# Patient Record
Sex: Male | Born: 1960 | Race: Black or African American | Hispanic: No | Marital: Married | Smoking: Never smoker
Health system: Southern US, Community
[De-identification: ages and names within clinical notes are randomized; demographics above are authoritative.]

## PROBLEM LIST (undated history)

## (undated) DIAGNOSIS — E7800 Pure hypercholesterolemia, unspecified: Secondary | ICD-10-CM

## (undated) DIAGNOSIS — I10 Essential (primary) hypertension: Secondary | ICD-10-CM

## (undated) HISTORY — PX: FOOT FUSION: SHX956

---

## 2021-01-01 ENCOUNTER — Emergency Department (HOSPITAL_BASED_OUTPATIENT_CLINIC_OR_DEPARTMENT_OTHER)
Admission: EM | Admit: 2021-01-01 | Discharge: 2021-01-02 | Disposition: A | Discharge status: Home / Self Care | Payer: No Typology Code available for payment source | Attending: Emergency Medicine | Admitting: Emergency Medicine

## 2021-01-01 ENCOUNTER — Emergency Department (HOSPITAL_BASED_OUTPATIENT_CLINIC_OR_DEPARTMENT_OTHER): Payer: No Typology Code available for payment source

## 2021-01-01 ENCOUNTER — Other Ambulatory Visit: Payer: Self-pay

## 2021-01-01 ENCOUNTER — Encounter (HOSPITAL_BASED_OUTPATIENT_CLINIC_OR_DEPARTMENT_OTHER): Payer: Self-pay | Admitting: Emergency Medicine

## 2021-01-01 DIAGNOSIS — R600 Localized edema: Secondary | ICD-10-CM | POA: Diagnosis not present

## 2021-01-01 DIAGNOSIS — R0789 Other chest pain: Secondary | ICD-10-CM | POA: Insufficient documentation

## 2021-01-01 DIAGNOSIS — R197 Diarrhea, unspecified: Secondary | ICD-10-CM | POA: Insufficient documentation

## 2021-01-01 DIAGNOSIS — R112 Nausea with vomiting, unspecified: Secondary | ICD-10-CM

## 2021-01-01 DIAGNOSIS — E860 Dehydration: Secondary | ICD-10-CM | POA: Insufficient documentation

## 2021-01-01 DIAGNOSIS — R55 Syncope and collapse: Secondary | ICD-10-CM

## 2021-01-01 DIAGNOSIS — Z91040 Latex allergy status: Secondary | ICD-10-CM | POA: Insufficient documentation

## 2021-01-01 DIAGNOSIS — I10 Essential (primary) hypertension: Secondary | ICD-10-CM | POA: Insufficient documentation

## 2021-01-01 DIAGNOSIS — R000 Tachycardia, unspecified: Secondary | ICD-10-CM | POA: Insufficient documentation

## 2021-01-01 HISTORY — DX: Pure hypercholesterolemia, unspecified: E78.00

## 2021-01-01 HISTORY — DX: Essential (primary) hypertension: I10

## 2021-01-01 LAB — CBC
HCT: 50.1 % (ref 39.0–52.0)
Hemoglobin: 17.4 g/dL — ABNORMAL HIGH (ref 13.0–17.0)
MCH: 30.1 pg (ref 26.0–34.0)
MCHC: 34.7 g/dL (ref 30.0–36.0)
MCV: 86.7 fL (ref 80.0–100.0)
Platelets: 210 10*3/uL (ref 150–400)
RBC: 5.78 MIL/uL (ref 4.22–5.81)
RDW: 13.5 % (ref 11.5–15.5)
WBC: 8.3 10*3/uL (ref 4.0–10.5)
nRBC: 0 % (ref 0.0–0.2)

## 2021-01-01 LAB — BASIC METABOLIC PANEL
Anion gap: 11 (ref 5–15)
BUN: 15 mg/dL (ref 6–20)
CO2: 18 mmol/L — ABNORMAL LOW (ref 22–32)
Calcium: 9.6 mg/dL (ref 8.9–10.3)
Chloride: 107 mmol/L (ref 98–111)
Creatinine, Ser: 1.39 mg/dL — ABNORMAL HIGH (ref 0.61–1.24)
GFR, Estimated: 59 mL/min — ABNORMAL LOW (ref >60)
Glucose, Bld: 135 mg/dL — ABNORMAL HIGH (ref 70–99)
Potassium: 4.2 mmol/L (ref 3.5–5.1)
Sodium: 136 mmol/L (ref 135–145)

## 2021-01-01 LAB — HEPATIC FUNCTION PANEL
ALT: 20 U/L (ref 0–44)
AST: 24 U/L (ref 15–41)
Albumin: 4.6 g/dL (ref 3.5–5.0)
Alkaline Phosphatase: 57 U/L (ref 38–126)
Bilirubin, Direct: 0.2 mg/dL (ref 0.0–0.2)
Indirect Bilirubin: 1.1 mg/dL — ABNORMAL HIGH (ref 0.3–0.9)
Total Bilirubin: 1.3 mg/dL — ABNORMAL HIGH (ref 0.3–1.2)
Total Protein: 8.6 g/dL — ABNORMAL HIGH (ref 6.5–8.1)

## 2021-01-01 LAB — TROPONIN I (HIGH SENSITIVITY): Troponin I (High Sensitivity): 2 ng/L (ref <18)

## 2021-01-01 MED ORDER — ONDANSETRON HCL 4 MG/2ML IJ SOLN
4.0000 mg | Freq: Once | INTRAMUSCULAR | Status: AC
  Administered 2021-01-01: 4 mg via INTRAVENOUS
  Filled 2021-01-01: qty 2

## 2021-01-01 MED ORDER — LACTATED RINGERS IV BOLUS
1000.0000 mL | Freq: Once | INTRAVENOUS | Status: AC
  Administered 2021-01-02: 1000 mL via INTRAVENOUS

## 2021-01-01 MED ORDER — ALUM & MAG HYDROXIDE-SIMETH 200-200-20 MG/5ML PO SUSP
30.0000 mL | Freq: Once | ORAL | Status: AC
  Administered 2021-01-01: 30 mL via ORAL
  Filled 2021-01-01: qty 30

## 2021-01-01 MED ORDER — ACETAMINOPHEN 500 MG PO TABS
1000.0000 mg | ORAL_TABLET | Freq: Once | ORAL | Status: AC
  Administered 2021-01-01: 1000 mg via ORAL
  Filled 2021-01-01: qty 2

## 2021-01-01 MED ORDER — LACTATED RINGERS IV BOLUS
1000.0000 mL | Freq: Once | INTRAVENOUS | Status: AC
  Administered 2021-01-01: 1000 mL via INTRAVENOUS

## 2021-01-01 NOTE — ED Provider Notes (Signed)
[facility] EMERGENCY DEPARTMENT Provider Note   CSN: [id number] Arrival date & time: 01/01/21  2115     History Chief Complaint  Patient presents with  . Chest Pain  . Vomiting  . Loss of Consciousness    Derrick Harrison is a 60 y.o. male.  60yo M w/ PMH including HTN, HLD, urethral stricture who p/w vomiting and diarrhea. Pt reports that around 9am this morning, he began having N/V associated w/ non-bloody diarrhea. No associated abd pain. This evening, he walked to the bathroom and began feeling lightheaded and sweaty. He then vomited and passed out. He denies any pain from the fall, wife thinks he may have bumped his head but he denies any head pain.  Approx 30 min after he passed out, he began having chest pain which he describes as a burning and tightness that goes from one side to the other side of his chest. It has been constant for the past 2.5 hours. No SOB but he does feel heart racing. No body aches but he does endorse sneezing. No cough or urinary symptoms. No sick contacts or recent travel.   The history is provided by the patient.  Chest Pain Associated symptoms: syncope   Loss of Consciousness Associated symptoms: chest pain        Past Medical History:  Diagnosis Date  . Elevated cholesterol   . Hypertension   Urethral stricture  There are no problems to display for this patient.   ** The histories are not reviewed yet. Please review them in the "History" navigator section and refresh this SmartLink.     No family history on file.  Social History   Tobacco Use  . Smoking status: Never Smoker  . Smokeless tobacco: Never Used  Vaping Use  . Vaping Use: Never used  Substance Use Topics  . Alcohol use: Yes    Alcohol/week: 6.0 standard drinks    Types: 6 Cans of beer per week    Comment: 3 x weekly  . Drug use: Never    Home Medications Prior to Admission medications   Not on File    Allergies    Latex  Review of Systems    Review of Systems  Cardiovascular: Positive for chest pain and syncope.  All other systems reviewed and are negative except that which was mentioned in HPI   Physical Exam Updated Vital Signs BP (!) 133/97   Pulse (!) 121   Temp (!) 100.5 F (38.1 C) (Oral)   Resp (!) 23   Ht 5\' 8"  (1.727 m)   Wt 102.8 kg   SpO2 97%   BMI 34.46 kg/m   Physical Exam Vitals and nursing note reviewed.  Constitutional:      General: He is not in acute distress.    Appearance: Normal appearance.  HENT:     Head: Normocephalic.     Comments: Trace edema above L eyebrow, non-tender Eyes:     Conjunctiva/sclera: Conjunctivae normal.  Cardiovascular:     Rate and Rhythm: Regular rhythm. Tachycardia present.     Heart sounds: Normal heart sounds. No murmur heard.   Pulmonary:     Effort: Pulmonary effort is normal.     Breath sounds: Normal breath sounds.  Abdominal:     General: Abdomen is flat. Bowel sounds are normal. There is no distension.     Palpations: Abdomen is soft.     Tenderness: There is no abdominal tenderness.  Musculoskeletal:     Cervical back:  Normal range of motion. No spinous process tenderness.     Right lower leg: No edema.     Left lower leg: No edema.  Skin:    General: Skin is warm and dry.  Neurological:     Mental Status: He is alert and oriented to person, place, and time.     Comments: fluent  Psychiatric:        Mood and Affect: Mood normal.        Behavior: Behavior normal.     ED Results / Procedures / Treatments   Labs (all labs ordered are listed, but only abnormal results are displayed) Labs Reviewed  CBC - Abnormal; Notable for the following components:      Result Value   Hemoglobin 17.4 (*)    All other components within normal limits  BASIC METABOLIC PANEL - Abnormal; Notable for the following components:   CO2 18 (*)    Glucose, Bld 135 (*)    Creatinine, Ser 1.39 (*)    GFR, Estimated 59 (*)    All other components within normal  limits  HEPATIC FUNCTION PANEL - Abnormal; Notable for the following components:   Total Protein 8.6 (*)    Total Bilirubin 1.3 (*)    Indirect Bilirubin 1.1 (*)    All other components within normal limits  TROPONIN I (HIGH SENSITIVITY)  TROPONIN I (HIGH SENSITIVITY)    EKG EKG Interpretation  Date/Time:  Sunday January 01 2021 21:23:28 EDT Ventricular Rate:  134 PR Interval:  128 QRS Duration: 78 QT Interval:  296 QTC Calculation: 442 R Axis:   -43 Text Interpretation: ** Poor data quality, interpretation may be adversely affected Sinus tachycardia Left axis deviation Pulmonary disease pattern Minimal voltage criteria for LVH, may be normal variant ( R in aVL ) Abnormal ECG No previous ECGs available Confirmed by Frederick Peers [phone]) on 01/01/2021 10:38:26 PM   Radiology DG Chest 2 View  Result Date: 01/01/2021 CLINICAL DATA:  Chest pain EXAM: CHEST - 2 VIEW COMPARISON:  None. FINDINGS: The heart size and mediastinal contours are within normal limits. No focal consolidation. No pleural effusion. No pneumothorax. Thoracic spondylosis. IMPRESSION: No acute cardiopulmonary disease. Electronically Signed   By: Maudry Mayhew MD   On: 01/01/2021 22:17    Procedures Procedures   Medications Ordered in ED Medications  lactated ringers bolus 1,000 mL (has no administration in time range)  lactated ringers bolus 1,000 mL (1,000 mLs Intravenous New Bag/Given 01/01/21 2255)  acetaminophen (TYLENOL) tablet 1,000 mg (1,000 mg Oral Given 01/01/21 2257)  ondansetron (ZOFRAN) injection 4 mg (4 mg Intravenous Given 01/01/21 2255)  alum & mag hydroxide-simeth (MAALOX/MYLANTA) 200-200-20 MG/5ML suspension 30 mL (30 mLs Oral Given 01/01/21 2257)    ED Course  I have reviewed the triage vital signs and the nursing notes.  Pertinent labs & imaging results that were available during my care of the patient were reviewed by me and considered in my medical decision making (see chart for details).     MDM Rules/Calculators/A&P                          Patient was tachycardic in the 120s, temp 100.5, reassuring blood pressure on exam.  No focal abdominal tenderness and denying any abdominal pain.  He had some mild swelling above his eyebrow but it was nontender and he denied any complaints of head or neck pain.  Although he is not sure whether he hit his  head, I do not feel he needs head imaging at this time.  Suspect vasovagal syncope in the setting of dehydration and vomiting.  Gave the patient above medications and IV fluids and obtained lab work.  Labs notable for bicarb 18, glucose 135, creatinine 1.39, normal LFTs, WBC 8.3, normal troponin.  EKG shows sinus tachycardia, no acute ischemic changes.  His symptoms sound atypical for ACS and it sounds like they have started in the setting of vomiting.  Chest x-ray is clear with no pneumomediastinum or concerning features to suggest Boerhaave's.  We will obtain a second troponin and p.o. challenge as well as reassess vital signs after fluids and Tylenol.  I am signing out to the oncoming provider who will follow up on patient's progress. Final Clinical Impression(s) / ED Diagnoses Final diagnoses:  Nausea vomiting and diarrhea  Dehydration  Vasovagal syncope  Atypical chest pain    Rx / DC Orders ED Discharge Orders    None       Mirta Mally, Ambrose Finland, MD 01/01/21 2334

## 2021-01-01 NOTE — ED Notes (Signed)
ED Provider at bedside. Dr. Little 

## 2021-01-01 NOTE — ED Triage Notes (Signed)
Reports n/v/d all day.  Passed out after getting off the toilet.  Denies any injury from fall.  Also c/o tightness/burning to center of the chest that started an hour pta.

## 2021-01-01 NOTE — ED Notes (Signed)
States started with N/V/D this am.  Nausea at present.  Vomited x 4; diarrhea x 8 or 9 times.  Denies abd. Pain.  Mid chest pain radiating to both left and right side.  Constant.

## 2021-01-02 LAB — TROPONIN I (HIGH SENSITIVITY): Troponin I (High Sensitivity): 2 ng/L (ref <18)

## 2021-01-02 MED ORDER — ONDANSETRON 4 MG PO TBDP: ORAL_TABLET | ORAL | 0 refills | Status: AC

## 2021-01-02 NOTE — ED Provider Notes (Signed)
2:46 AM Assumed care from Dr. Clarene Duke, please see their note for full history, physical and decision making until this point. In brief this is a 60 y.o. year old male who presented to the ED tonight with Chest Pain, Vomiting, and Loss of Consciousness     Likely dehydrated but  Had some atypical CP as well so pending fluids, repeat troponin and reassessment.   After multiple fluid boluses and reassessments, no longer vomiting, improved symptoms, troponins good, orthostatics are stable.   Discharge instructions, including strict return precautions for new or worsening symptoms, given. Patient and/or family verbalized understanding and agreement with the plan as described.   Labs, studies and imaging reviewed by myself and considered in medical decision making if ordered. Imaging interpreted by radiology.  Labs Reviewed  CBC - Abnormal; Notable for the following components:      Result Value   Hemoglobin 17.4 (*)    All other components within normal limits  BASIC METABOLIC PANEL - Abnormal; Notable for the following components:   CO2 18 (*)    Glucose, Bld 135 (*)    Creatinine, Ser 1.39 (*)    GFR, Estimated 59 (*)    All other components within normal limits  HEPATIC FUNCTION PANEL - Abnormal; Notable for the following components:   Total Protein 8.6 (*)    Total Bilirubin 1.3 (*)    Indirect Bilirubin 1.1 (*)    All other components within normal limits  TROPONIN I (HIGH SENSITIVITY)  TROPONIN I (HIGH SENSITIVITY)    DG Chest 2 View  Final Result      No follow-ups on file.    Navil Kole, Barbara Cower, MD 01/02/21 [phone]

## 2022-10-30 IMAGING — DX DG CHEST 2V
2 series · 2 of 2 positions shown · non-contrast
Comparison: None.

CLINICAL DATA: Chest pain

EXAM:
CHEST - 2 VIEW

[chest lat]
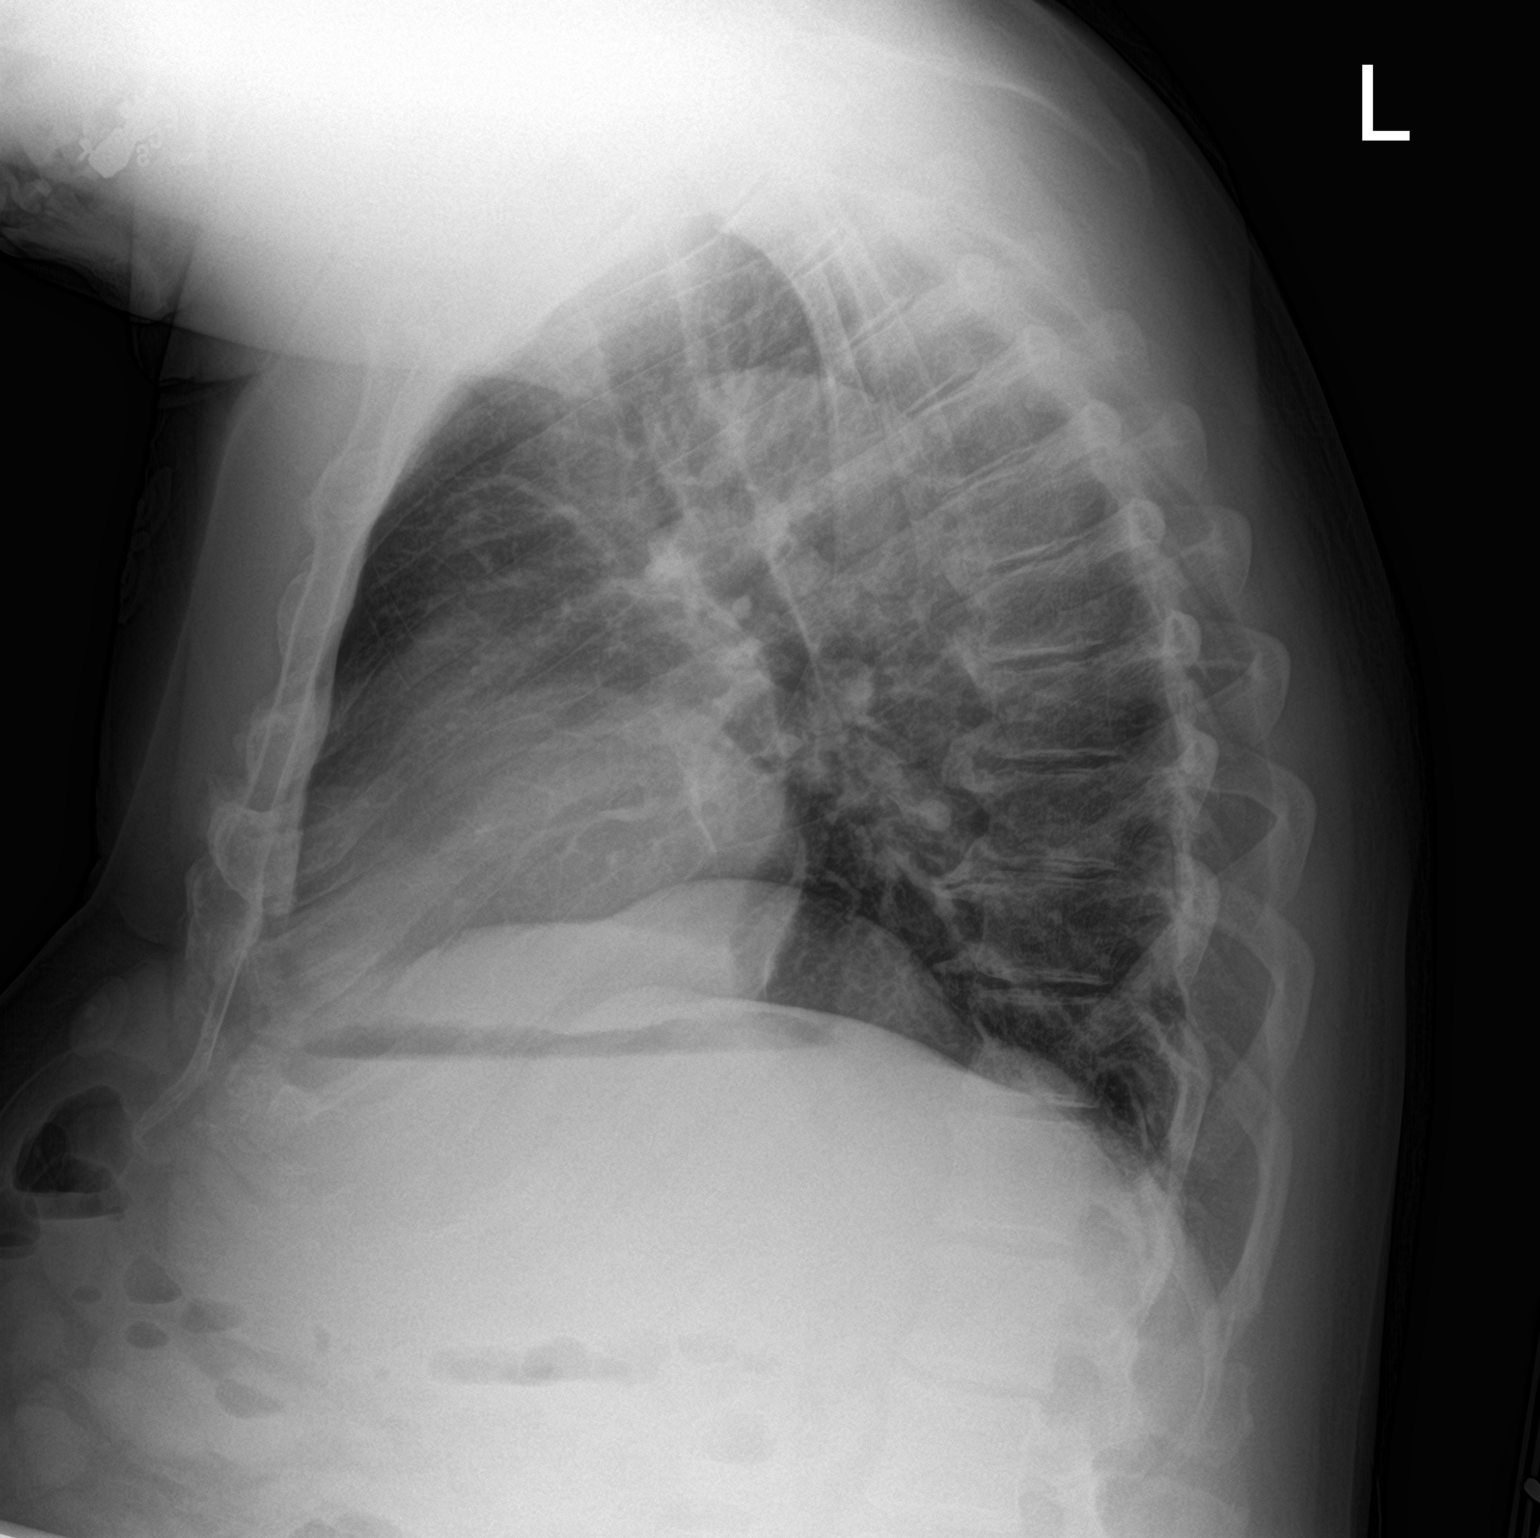

[chest ap]
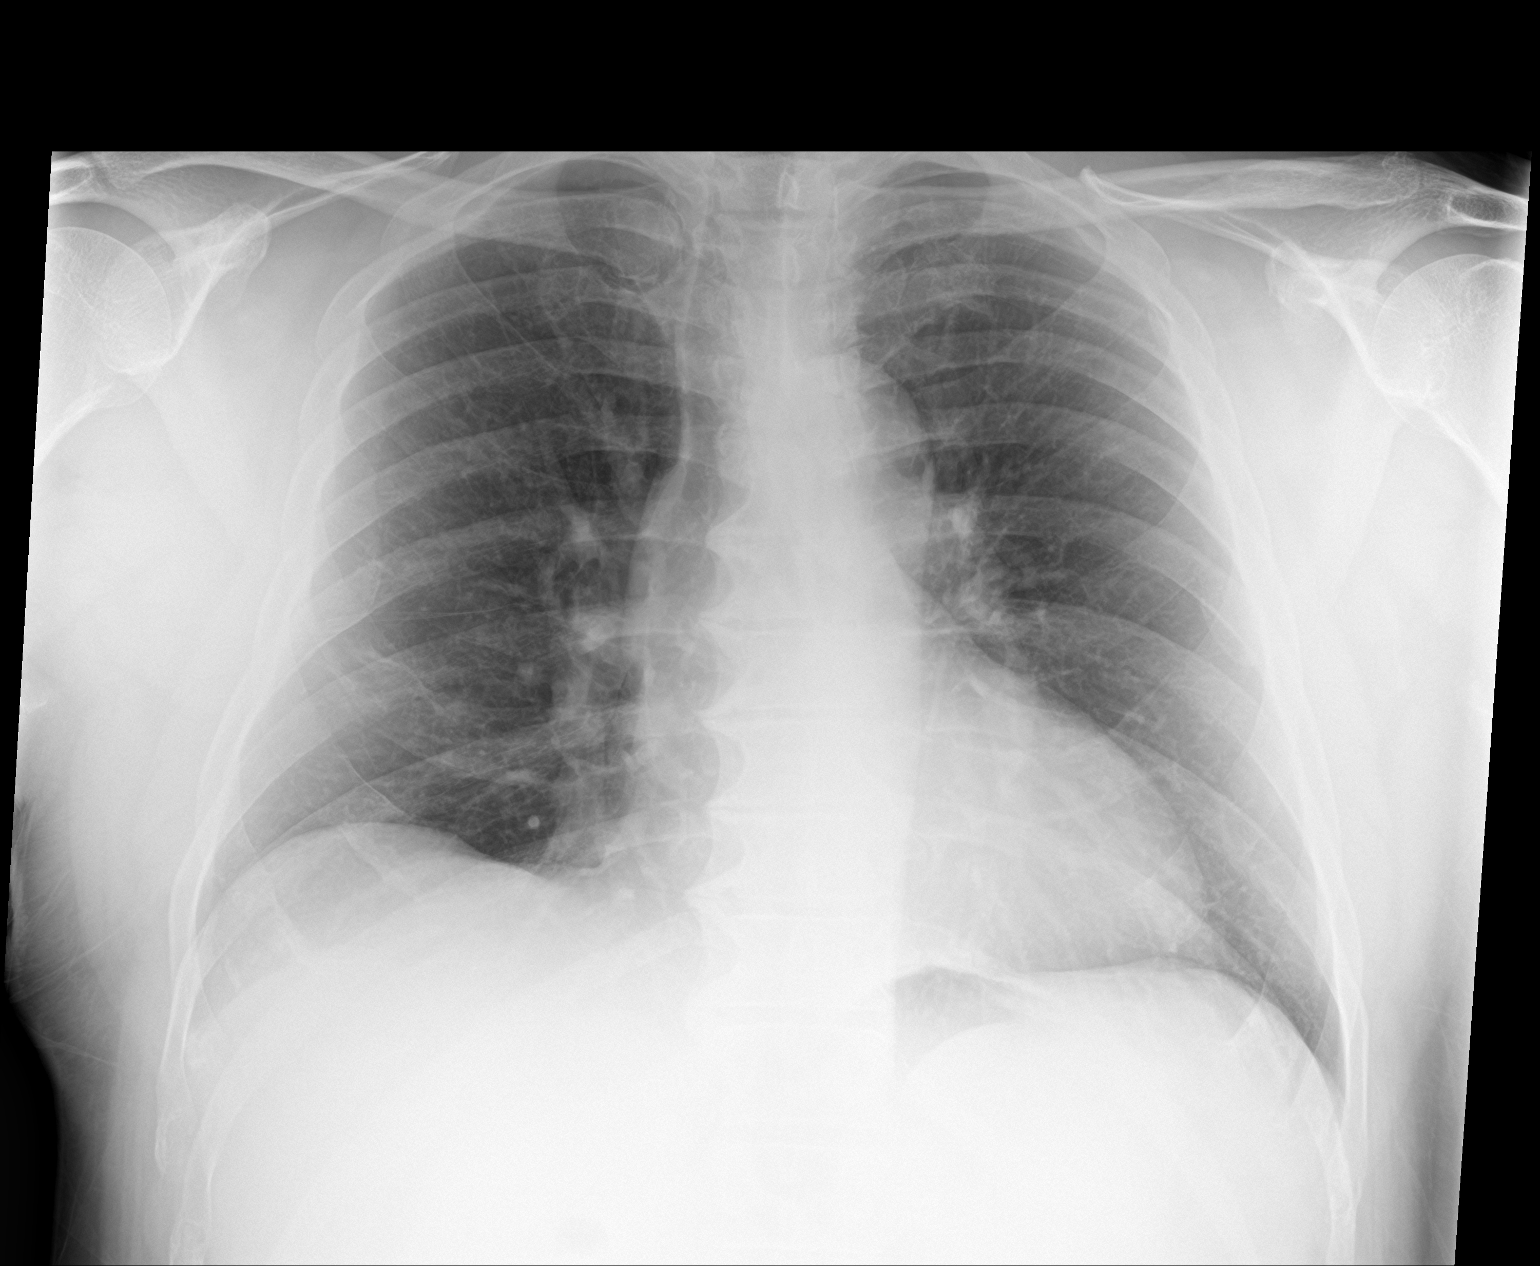

[2 of 2 positions shown; findings below may reference images not displayed]

FINDINGS: The heart size and mediastinal contours are within normal limits. No
focal consolidation. No pleural effusion. No pneumothorax. Thoracic
spondylosis.
IMPRESSION: No acute cardiopulmonary disease.

## 2023-01-23 ENCOUNTER — Encounter: Payer: Self-pay | Admitting: Internal Medicine

## 2023-01-23 ENCOUNTER — Ambulatory Visit (INDEPENDENT_AMBULATORY_CARE_PROVIDER_SITE_OTHER): Payer: No Typology Code available for payment source | Admitting: Internal Medicine

## 2023-01-23 ENCOUNTER — Other Ambulatory Visit: Payer: Self-pay

## 2023-01-23 VITALS — BP 124/76 | HR 94 | Temp 97.9°F | Resp 20 | Ht 66.5 in | Wt 229.4 lb

## 2023-01-23 DIAGNOSIS — T7800XA Anaphylactic reaction due to unspecified food, initial encounter: Secondary | ICD-10-CM

## 2023-01-23 DIAGNOSIS — J3089 Other allergic rhinitis: Secondary | ICD-10-CM | POA: Diagnosis not present

## 2023-01-23 DIAGNOSIS — J343 Hypertrophy of nasal turbinates: Secondary | ICD-10-CM

## 2023-01-23 DIAGNOSIS — H1013 Acute atopic conjunctivitis, bilateral: Secondary | ICD-10-CM

## 2023-01-23 DIAGNOSIS — K219 Gastro-esophageal reflux disease without esophagitis: Secondary | ICD-10-CM | POA: Diagnosis not present

## 2023-01-23 DIAGNOSIS — J301 Allergic rhinitis due to pollen: Secondary | ICD-10-CM | POA: Diagnosis not present

## 2023-01-23 MED ORDER — CETIRIZINE HCL 10 MG PO TABS: 10.0000 mg | ORAL_TABLET | Freq: Every day | ORAL | 5 refills | Status: DC | PRN

## 2023-01-23 MED ORDER — OLOPATADINE HCL 0.2 % OP SOLN: 1.0000 [drp] | Freq: Every day | OPHTHALMIC | 5 refills | Status: AC | PRN

## 2023-01-23 MED ORDER — AZELASTINE HCL 0.1 % NA SOLN: 1.0000 | Freq: Two times a day (BID) | NASAL | 5 refills | Status: AC | PRN

## 2023-01-23 MED ORDER — FLUTICASONE PROPIONATE 50 MCG/ACT NA SUSP: 2.0000 | Freq: Every day | NASAL | 5 refills | Status: DC

## 2023-01-23 NOTE — Progress Notes (Signed)
NEW PATIENT  Date of Service/Encounter:  01/23/23  Consult requested by: [facility]   Subjective:   Derrick Harrison (DOB: 02-08-1961) is a 62 y.o. male who presents to the clinic on 01/23/2023 with a chief complaint of Establish Care and Sinus Problem (Watery eyes, sneezing for 2 years increased pollen) .    History obtained from: chart review and patient.   Rhinitis:  Started about 2 years ago.   Symptoms include: nasal congestion, rhinorrhea, sneezing, watery eyes, and itchy eyes  Occurs seasonally-Spring/Summer Potential triggers: not sure  Treatments tried:  None  Previous allergy testing: no History of reflux/heartburn: yes controlled with Omeprazole 20mg  daily. History of sinus surgery: none Nonallergic triggers: none   Concern for Food Allergy:  Foods of concern: mushrooms on a pizza   History of reaction:  Broke out in hives on face and had throat swelling. Had to go to the ER and got a shot, not sure what.  Now avoids it.    Previous allergy testing no Carries an epinephrine autoinjector: no   Past Medical History: Past Medical History:  Diagnosis Date   Elevated cholesterol    Hypertension   CKD, stable Urethral stricture   Past Surgical History: Urethral surgery R fifth metatarsal surgery   Family History: No family history on file.  Social History:  Lives in a 20 year house Flooring in bedroom: carpet Pets: dog Tobacco use/exposure: none Job: retired  Medication List:  Allergies as of 01/23/2023       Reactions   Latex Hives        Medication List        Accurate as of Jan 23, 2023 11:34 AM. If you have any questions, ask your nurse or doctor.          azelastine 0.1 % nasal spray Commonly known as: ASTELIN Place 1 spray into both nostrils 2 (two) times daily as needed. Use in each nostril as directed Started by: Birder Robson, MD   cetirizine 10 MG tablet Commonly known as: ZyrTEC Allergy Take 1 tablet (10  mg total) by mouth daily as needed for allergies. Started by: Birder Robson, MD   fluticasone 50 MCG/ACT nasal spray Commonly known as: FLONASE Place 2 sprays into both nostrils daily. Started by: Birder Robson, MD   Olopatadine HCl 0.2 % Soln Apply 1 drop to eye daily as needed (itchy watery eyes). Started by: Birder Robson, MD   ondansetron 4 MG disintegrating tablet Commonly known as: Zofran ODT 4mg  ODT q4 hours prn nausea/vomit         REVIEW OF SYSTEMS: Pertinent positives and negatives discussed in HPI.   Objective:   Physical Exam: BP 124/76 (BP Location: Left Arm, Patient Position: Sitting, Cuff Size: Normal)   Pulse 94   Temp 97.9 F (36.6 C) (Temporal)   Resp 20   Ht 5' 6.5" (1.689 m)   Wt 229 lb 6.4 oz (104.1 kg)   SpO2 98%   BMI 36.47 kg/m  Body mass index is 36.47 kg/m. GEN: alert, well developed HEENT: clear conjunctiva, TM grey and translucent, nose with + inferior turbinate hypertrophy, boggy nasal mucosa, slight clear rhinorrhea, + cobblestoning HEART: regular rate and rhythm, no murmur LUNGS: clear to auscultation bilaterally, no coughing, unlabored respiration ABDOMEN: soft, non distended  SKIN: no rashes or lesions  Reviewed:  07/03/2023: [facility]; has PMH of anxiety/depression, HTN, HLD, CKD, OSA, OA of knee, GERD.  On lisinopril, omeprazole, sertraline, ssumatriptan,  trazodone, slidenafil PRN.   01/03/2023: seen in urgent care for headaches and elevated BP. Discussed better control for HTN with regular check and follow up with PCP.   11/30/2022: followed by Dr. 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  Has also resulted in CKD.   Skin Testing:  Skin prick testing was placed, which includes aeroallergens/foods, histamine control, and saline control.  Verbal consent was obtained prior to placing test.  Patient tolerated procedure well.  Allergy testing results were read and interpreted by myself,  documented by clinical staff. Adequate positive and negative control.  Results discussed with patient/family.  Airborne Adult Perc - 01/23/23 1009     Time Antigen Placed 1000    Allergen Manufacturer Greer    Location Back    Number of Test 59    1. Control-Buffer 50% Glycerol Negative    2. Control-Histamine 1 mg/ml 3+    3. Albumin saline Negative    4. Bahia Negative    5. French Southern Territories Negative    6. Johnson Negative    7. Kentucky Blue 2+    8. Meadow Fescue 2+    9. Perennial Rye 2+    10. Sweet Vernal 2+    11. Timothy Negative    12. Cocklebur Negative    13. Burweed Marshelder Negative    14. Ragweed, short Negative    15. Ragweed, Giant Negative    16. Plantain,  English 2+    17. Lamb's Quarters 2+    18. Sheep Sorrell Negative    19. Rough Pigweed Negative    20. Marsh Elder, Rough Negative    21. Mugwort, Common Negative    22. Ash mix Negative    23. Birch mix Negative    25. Box, Elder Negative    26. Cedar, red Negative    27. Cottonwood, Guinea-Bissau Negative    28. Elm mix Negative    29. Hickory Negative    30. Maple mix Negative    31. Oak, Guinea-Bissau mix Negative    32. Pecan Pollen Negative    33. Pine mix Negative    34. Sycamore Eastern Negative    35. Walnut, Black Pollen Negative    36. Alternaria alternata Negative    37. Cladosporium Herbarum Negative    38. Aspergillus mix Negative    39. Penicillium mix Negative    40. Bipolaris sorokiniana (Helminthosporium) Negative    41. Drechslera spicifera (Curvularia) Negative    42. Mucor plumbeus Negative    43. Fusarium moniliforme Negative    44. Aureobasidium pullulans (pullulara) Negative    45. Rhizopus oryzae Negative    46. Botrytis cinera Negative    47. Epicoccum nigrum Negative    48. Phoma betae Negative    49. Candida Albicans Negative    50. Trichophyton mentagrophytes Negative    51. Mite, D Farinae  5,000 AU/ml Negative    52. Mite, D Pteronyssinus  5,000 AU/ml Negative    53. Cat  Hair 10,000 BAU/ml Negative    54.  Dog Epithelia Negative    55. Mixed Feathers Negative    56. Horse Epithelia Negative    57. Cockroach, German Negative    58. Mouse Negative    59. Tobacco Leaf Negative             Intradermal - 01/23/23 1109     Time Antigen Placed 1045    Location Arm    Number of Test 13    Control 3+  French Southern Territ[place] Negative    Johnson Negative    Ragweed mix Negative    Tree mix Negative    Mold 1 Negative    Mold 2 2+    Mold 3 2+    Mold 4 2+    Cat Negative    Dog Negative    Cockroach 3+    Mite mix 3+             Food Adult Perc - 01/23/23 1000     Time Antigen Placed 1000    Allergen Manufacturer Greer    Location Back    Number of allergen test 1               Assessment:   1. Seasonal allergic rhinitis due to pollen   2. Allergy with anaphylaxis due to food   3. Gastroesophageal reflux disease, unspecified whether esophagitis present   4. Nasal turbinate hypertrophy   5. Allergic conjunctivitis of both eyes   6. Allergic rhinitis caused by mold   7. Allergic rhinitis due to dust mite   8. Allergic rhinitis due to insect     Plan/Recommendations:  Allergic Rhinitis: - Due to turbinate hypertrophy and seasonal symptoms, performed skin testing to identify aeroallergen triggers.   - Positive skin test 01/2023: grasses, weeds, mold, cockroach, dust mite - Avoidance measures discussed. - Use nasal saline rinses before nose sprays such as with Neilmed Sinus Rinse.  Use distilled water.   - Use Flonase 2 sprays each nostril daily. Aim upward and outward. - Use Azelastine 1-2 sprays each nostril twice daily as needed for congestion, sneezing, runny nose, drainage. Aim upward and outward. - Use Zyrtec 10 mg daily as needed for runny nose, sneezing, itchy watery eyes .  - For eyes, use Olopatadine or Ketotifen 1 eye drop daily as needed for itchy, watery eyes.  Available over the counter, if not covered by insurance.  -  Consider allergy shots as long term control of your symptoms by teaching your immune system to be more tolerant of your allergy triggers. Given information on this today to discuss with [facility].   Food allergy:  - Initial rxn: hives and throat closure with mushrooms - today's skin testing 01/2023 was negative to mushrooms. Will obtain sIgE to confirm.  - please strictly avoid mushrooms for now.  - for SKIN only reaction, okay to take Benadryl 25mg  capsules every 6 hours  GERD -Avoid lying down for at least two hours after a meal or after drinking acidic beverages, like soda, or other caffeinated beverages. This can help to prevent stomach contents from flowing back into the esophagus. -Keep your head elevated while you sleep. Using an extra pillow or two can also help to prevent reflux. -Eat smaller and more frequent meals each day instead of a few large meals. This promotes digestion and can aid in preventing heartburn. -Wear loose-fitting clothes to ease pressure on the stomach, which can worsen heartburn and reflux. -Reduce excess weight around the midsection. This can ease pressure on the stomach. Such pressure can force some stomach contents back up the esophagus.      Return in about 6 weeks (around 03/06/2023).  Alesia Morin, MD [facility]

## 2023-01-23 NOTE — Patient Instructions (Addendum)
Derrick Harrison Return in about 6 weeks (around 03/06/2023).   Allergic Rhinitis: - Positive skin test 01/2023: grasses, weeds, mold, cockroach, dust mite - Avoidance measures discussed. - Use nasal saline rinses before nose sprays such as with Neilmed Sinus Rinse.  Use distilled water.   - Use Flonase 2 sprays each nostril daily. Aim upward and outward. - Use Azelastine 1-2 sprays each nostril twice daily as needed for congestion, sneezing, runny nose, drainage. Aim upward and outward. - Use Zyrtec 10 mg daily as needed for runny nose, sneezing, itchy watery eyes .  - For eyes, use Olopatadine or Ketotifen 1 eye drop daily as needed for itchy, watery eyes.  Available over the counter, if not covered by insurance.  - Consider allergy shots as long term control of your symptoms by teaching your immune system to be more tolerant of your allergy triggers   Food allergy:  - today's skin testing 01/2023 was negative to mushrooms. Will obtain sIgE to confirm.  - please strictly avoid mushrooms for now.  - for SKIN only reaction, okay to take Benadryl 25mg  capsules every 6 hours  GERD -Avoid lying down for at least two hours after a meal or after drinking acidic beverages, like soda, or other caffeinated beverages. This can help to prevent stomach contents from flowing back into the esophagus. -Keep your head elevated while you sleep. Using an extra pillow or two can also help to prevent reflux. -Eat smaller and more frequent meals each day instead of a few large meals. This promotes digestion and can aid in preventing heartburn. -Wear loose-fitting clothes to ease pressure on the stomach, which can worsen heartburn and reflux. -Reduce excess weight around the midsection. This can ease pressure on the stomach. Such pressure can force some stomach contents back up the esophagus.    ALLERGEN AVOIDANCE MEASURES   Dust Mites Use central air conditioning and heat; and change the filter monthly.  Pleated  filters work better than mesh filters.  Electrostatic filters may also be used; wash the filter monthly.  Window air conditioners may be used, but do not clean the air as well as a central air conditioner.  Change or wash the filter monthly. Keep windows closed.  Do not use attic fans.   Encase the mattress, box springs and pillows with zippered, dust proof covers. Wash the bed linens in hot water weekly.   Remove carpet, especially from the bedroom. Remove stuffed animals, throw pillows, dust ruffles, heavy drapes and other items that collect dust from the bedroom. Do not use a humidifier.   Use wood, vinyl or leather furniture instead of cloth furniture in the bedroom. Keep the indoor humidity at 30 - 40%.  Monitor with a humidity gauge.  Molds - Indoor avoidance Use air conditioning to reduce indoor humidity.  Do not use a humidifier. Keep indoor humidity at 30 - 40%.  Use a dehumidifier if needed. In the bathroom use an exhaust fan or open a window after showering.  Wipe down damp surfaces after showering.  Clean bathrooms with a mold-killing solution (diluted bleach, or products like Tilex, etc) at least once a month. In the kitchen use an exhaust fan to remove steam from cooking.  Throw away spoiled foods immediately, and empty garbage daily.  Empty water pans below self-defrosting refrigerators frequently. Vent the clothes dryer to the outside. Limit indoor houseplants; mold grows in the dirt.  No houseplants in the bedroom. Remove carpet from the bedroom. Encase the mattress and box  springs with a zippered encasing.  Molds - Outdoor avoidance Avoid being outside when the grass is being mowed, or the ground is tilled. Avoid playing in leaves, pine straw, hay, etc.  Dead plant materials contain mold. Avoid going into barns or grain storage areas. Remove leaves, clippings and compost from around the home.  Cockroach Limit spread of food around the house; especially keep food out of  bedrooms. Keep food and garbage in closed containers with a tight lid.  Never leave food out in the kitchen.  Do not leave out pet food or dirty food bowls. Mop the kitchen floor and wash countertops at least once a week. Repair leaky pipes and faucets so there is no standing water to attract roaches. Plug up cracks in the house through which cockroaches can enter. Use bait stations and approved pesticides to reduce cockroach infestation. Pollen Avoidance Pollen levels are highest during the mid-day and afternoon.  Consider this when planning outdoor activities. Avoid being outside when the grass is being mowed, or wear a mask if the pollen-allergic person must be the one to mow the grass. Keep the windows closed to keep pollen outside of the home. Use an air conditioner to filter the air. Take a shower, wash hair, and change clothing after working or playing outdoors during pollen season.

## 2023-01-29 ENCOUNTER — Other Ambulatory Visit: Payer: Self-pay | Admitting: Internal Medicine

## 2023-01-29 DIAGNOSIS — J3089 Other allergic rhinitis: Secondary | ICD-10-CM

## 2023-01-29 DIAGNOSIS — J301 Allergic rhinitis due to pollen: Secondary | ICD-10-CM

## 2023-01-29 MED ORDER — EPINEPHRINE 0.3 MG/0.3ML IJ SOAJ: 0.3000 mg | INTRAMUSCULAR | 1 refills | Status: DC | PRN

## 2023-01-29 NOTE — Progress Notes (Signed)
AIT orders placed.

## 2023-01-30 DIAGNOSIS — J3089 Other allergic rhinitis: Secondary | ICD-10-CM | POA: Diagnosis not present

## 2023-01-30 NOTE — Progress Notes (Signed)
VIALS EXP 01-30-24 

## 2023-01-30 NOTE — Progress Notes (Signed)
Aeroallergen Immunotherapy   Ordering Provider: Alesia Morin   Patient Details  Name: Derrick Harrison  MRN: [id number]  Date of Birth: 12/30/1960   Order 1 of 2   Vial Label: grasses, weeds, CR, DM   0.3 ml (Volume)  BAU Concentration -- 7 Grass Mix* 100,000 ([address] [place], [place], [place], Perennial R[place], Red[place], Sweet Vernal, Timothy)  0.2 ml (Volume)  1:10 Concentration -- Plantain English  0.2 ml (Volume)  1:20 Concentration -- Lamb's Quarters*  0.3 ml (Volume)  1:20 Concentration -- Cockroach, German  0.5 ml (Volume)   AU Concentration -- Mite Mix (DF 5,000 & DP 5,000)    1.5  ml Extract Subtotal  3.5  ml Diluent  5.0  ml Maintenance Total   Schedule:  B  Silver Vial (1:1,000,000): Schedule B (6 doses)  Blue Vial (1:100,000): Schedule B (6 doses)  Yellow Vial (1:10,000): Schedule B (6 doses)  Green Vial (1:1,000): Schedule B (6 doses)  Red Vial (1:100): Schedule A (10 doses)   Special Instructions: Bring Epipen and Sign Consentt

## 2023-01-30 NOTE — Progress Notes (Signed)
Aeroallergen Immunotherapy   Ordering Provider: Alesia Morin   Patient Details  Name: Derrick Harrison  MRN: [id number]  Date of Birth: 08/14/1961   Order 2 of 2   Vial Label: mold   0.2 ml (Volume)  1:10 Concentration -- Aspergillus mix  0.2 ml (Volume)  1:10 Concentration -- Penicillium mix  0.2 ml (Volume)  1:20 Concentration -- Bipolaris sorokiniana  0.2 ml (Volume)  1:20 Concentration -- Drechslera spicifera  0.2 ml (Volume)  1:10 Concentration -- Mucor plumbeus  0.2 ml (Volume)  1:10 Concentration -- Fusarium moniliforme  0.2 ml (Volume)  1:40 Concentration -- Aureobasidium pullulans  0.2 ml (Volume)  1:10 Concentration -- Rhizopus oryzae    1.6  ml Extract Subtotal  3.4  ml Diluent  5.0  ml Maintenance Total   Schedule:  B  Silver Vial (1:1,000,000): Schedule B (6 doses)  Blue Vial (1:100,000): Schedule B (6 doses)  Yellow Vial (1:10,000): Schedule B (6 doses)  Green Vial (1:1,000): Schedule B (6 doses)  Red Vial (1:100): Schedule A (10 doses)   Special Instructions: Bring Epipen and Sign consent

## 2023-01-31 DIAGNOSIS — J302 Other seasonal allergic rhinitis: Secondary | ICD-10-CM | POA: Diagnosis not present

## 2023-02-18 ENCOUNTER — Ambulatory Visit (INDEPENDENT_AMBULATORY_CARE_PROVIDER_SITE_OTHER): Payer: No Typology Code available for payment source

## 2023-02-18 DIAGNOSIS — J309 Allergic rhinitis, unspecified: Secondary | ICD-10-CM | POA: Diagnosis not present

## 2023-02-18 NOTE — Progress Notes (Signed)
Immunotherapy   Patient Details  Name: Eliah Marquard MRN: [id number] Date of Birth: 08-Feb-1961  02/18/2023  Wynetta Emery started injections for Blue 1:100,000 (MOLD and G-W-CR-DM) Following schedule: B  Frequency:1 time per week Epi-Pen:Epi-Pen Available  Consent signed and patient instructions given.   Jerrelle Michelsen J Mosetta Ferdinand 02/18/2023, 8:55 AM

## 2023-02-27 ENCOUNTER — Ambulatory Visit (INDEPENDENT_AMBULATORY_CARE_PROVIDER_SITE_OTHER): Payer: No Typology Code available for payment source

## 2023-02-27 DIAGNOSIS — J309 Allergic rhinitis, unspecified: Secondary | ICD-10-CM | POA: Diagnosis not present

## 2023-03-07 ENCOUNTER — Ambulatory Visit (INDEPENDENT_AMBULATORY_CARE_PROVIDER_SITE_OTHER): Payer: No Typology Code available for payment source

## 2023-03-07 DIAGNOSIS — J309 Allergic rhinitis, unspecified: Secondary | ICD-10-CM

## 2023-03-18 ENCOUNTER — Ambulatory Visit (INDEPENDENT_AMBULATORY_CARE_PROVIDER_SITE_OTHER): Payer: No Typology Code available for payment source

## 2023-03-18 DIAGNOSIS — J309 Allergic rhinitis, unspecified: Secondary | ICD-10-CM

## 2023-03-20 ENCOUNTER — Ambulatory Visit: Payer: No Typology Code available for payment source | Admitting: Internal Medicine

## 2023-03-20 ENCOUNTER — Encounter: Payer: Self-pay | Admitting: Internal Medicine

## 2023-03-20 ENCOUNTER — Ambulatory Visit: Payer: Non-veteran care | Admitting: Internal Medicine

## 2023-03-20 VITALS — BP 114/72 | HR 111 | Temp 98.6°F | Resp 16

## 2023-03-20 DIAGNOSIS — T781XXD Other adverse food reactions, not elsewhere classified, subsequent encounter: Secondary | ICD-10-CM | POA: Diagnosis not present

## 2023-03-20 DIAGNOSIS — J3089 Other allergic rhinitis: Secondary | ICD-10-CM

## 2023-03-20 DIAGNOSIS — J302 Other seasonal allergic rhinitis: Secondary | ICD-10-CM

## 2023-03-20 DIAGNOSIS — J343 Hypertrophy of nasal turbinates: Secondary | ICD-10-CM | POA: Diagnosis not present

## 2023-03-20 DIAGNOSIS — H1013 Acute atopic conjunctivitis, bilateral: Secondary | ICD-10-CM

## 2023-03-20 DIAGNOSIS — H1010 Acute atopic conjunctivitis, unspecified eye: Secondary | ICD-10-CM

## 2023-03-20 MED ORDER — CETIRIZINE HCL 10 MG PO TABS: 10.0000 mg | ORAL_TABLET | Freq: Every day | ORAL | 5 refills | Status: AC | PRN

## 2023-03-20 MED ORDER — FLUTICASONE PROPIONATE 50 MCG/ACT NA SUSP: 2.0000 | Freq: Every day | NASAL | 5 refills | Status: AC

## 2023-03-20 NOTE — Progress Notes (Signed)
Follow Up Note  RE: Derrick Harrison MRN: [id number] DOB: 06-Jul-1961 Date of Office Visit: 03/20/2023  Referring provider: [facility] Primary care provider: [facility], [facility]  Chief Complaint: Allergies  History of Present Illness: I had the pleasure of seeing Derrick Harrison for a follow up visit at the [facility] on 03/20/2023. He is a 62 y.o. male, who is being followed for allergic rhinitis on AIT. His previous allergy office visit was on 01/23/23 with  Dr. Allena Katz  . Today is a regular follow up visit.  History obtained from patient, chart review.  Pertinent History/Diagnostics:  - Allergic Rhinitis: flares spring/summer, onset 2022, nasal congestion, drainage, watery itchy yes   - SPT environmental panel (01/23/23): grasses, weeds, mold, cockroach, dust mite   - AIT started 02/18/23   - Comorbid GERD- controlled on omeprazole  - Food Allergy (mushroom)  - Hx of reaction: urticaria on face and throat swelling, rx'd in ED   - SPT select foods (01/23/23): negative   - sIgE (01/23/23) ordered not done   Today he reports:  Derrick Harrison is going well  Denies any LR/SR  Taking flonase in AM and zyrtec once daily.  Denies any breakthrough symptoms.  Has avoided mushrooms since.  Has tolerated pizza without mushrooms since.  Has epipen and has not needed to use it.     Assessment and Plan: Derrick Harrison is a 62 y.o. male with: Seasonal and perennial allergic rhinitis  Seasonal allergic conjunctivitis  Nasal turbinate hypertrophy  Adverse food reaction, subsequent encounter   Plan: Patient Instructions  Chronic Rhinitis Seasonal and Perennial Allergic:  - Control: Well controlled  - Prevention:  -  Continue allergen avoidance  - Consider allergy shots as long term control of your symptoms by teaching your immune system to be more tolerant of your allergy triggers  - Symptom control: - Continue Nasal Steroid Spray: Best results if used daily. - Options  include Flonase (fluticasone), Nasocort (triamcinolone), Nasonex (mometasome) 1- 2 sprays in each nostril daily.  - All can be purchased over-the-counter if not covered by insurance. -  Change   Antihistamine: to daily as needed.   -Options include Zyrtec (Cetirizine) 10mg , Claritin (Loratadine) 10mg , Allegra (Fexofenadine) 180mg , or Xyzal (Levocetirinze) 5mg  - Can be purchased over-the-counter if not covered by insurance.  - AIT PLAN:  continue allergy injections per protocol and carry epipen   Allergic Conjunctivitis:  - Continue Allergy Eye drops-great options include Pataday (Olopatadine) or Zaditor (ketotifen) for eye symptoms daily as needed-both sold over the counter if not covered by insurance. and Rewetting Drops such as Systane,TheraTears, etc  -Avoid eye drops that say red eye relief as they may contain medications that dry out your eyes.  Food allergy:  - please strictly avoid Mushrooms  - for SKIN only reaction, okay to take Benadryl 1 capsules every 6 hours - for SKIN + ANY additional symptoms, OR IF concern for LIFE THREATENING reaction = Epipen Autoinjector EpiPen 0.3 mg. - If using Epinephrine autoinjector, call 911   Follow up: 12 months   Thank you so much for letting me partake in your care today.  Don't hesitate to reach out if you have any additional concerns!  Ferol Luz, MD  [facility] [place] [place]     No orders of the defined types were placed in this encounter.   Lab Orders  No laboratory test(s) ordered today   Diagnostics: None done   Medication List:  Current Outpatient  Medications  Medication Sig Dispense Refill   allopurinol (ZYLOPRIM) 300 MG tablet Take by mouth.     azelastine (ASTELIN) 0.1 % nasal spray Place 1 spray into both nostrils 2 (two) times daily as needed. Use in each nostril as directed 30 mL 5   cetirizine (ZYRTEC ALLERGY) 10 MG tablet Take 1 tablet (10 mg total) by mouth daily as needed for allergies. 30  tablet 5   EPINEPHrine (EPIPEN 2-PAK) 0.3 mg/0.3 mL IJ SOAJ injection Inject 0.3 mg into the muscle as needed for anaphylaxis. 2 each 1   fluticasone (FLONASE) 50 MCG/ACT nasal spray Place 2 sprays into both nostrils daily. 16 g 5   lisinopril (ZESTRIL) 10 MG tablet Take by mouth.     Olopatadine HCl 0.2 % SOLN Apply 1 drop to eye daily as needed (itchy watery eyes). 2.5 mL 5   ondansetron (ZOFRAN ODT) 4 MG disintegrating tablet 4mg  ODT q4 hours prn nausea/vomit 30 tablet 0   sertraline (ZOLOFT) 100 MG tablet Take by mouth.     sildenafil (VIAGRA) 100 MG tablet Take by mouth.     SUMAtriptan (IMITREX) 50 MG tablet Take by mouth.     tamsulosin (FLOMAX) 0.4 MG CAPS capsule Take by mouth.     traZODone (DESYREL) 50 MG tablet Take by mouth.     No current facility-administered medications for this visit.   Allergies: Allergies  Allergen Reactions   Mushroom Extract Complex Swelling   Atorvastatin Hives and Other (See Comments)    Bones ache   Latex Hives   Tea Tree Oil Rash    Mushroom allergy   I reviewed his past medical history, social history, family history, and environmental history and no significant changes have been reported from his previous visit.  ROS: All others negative except as noted per HPI.   Objective: BP 114/72   Pulse (!) 111   Temp 98.6 F (37 C) (Temporal)   Resp 16   SpO2 95%  There is no height or weight on file to calculate BMI. General Appearance:  Alert, cooperative, no distress, appears stated age  Head:  Normocephalic, without obvious abnormality, atraumatic  Eyes:  Conjunctiva clear, EOM's intact  Nose: Nares normal, hypertrophic turbinates, normal mucosa, no visible anterior polyps, and septum midline  Throat: Lips, tongue normal; teeth and gums normal, normal posterior oropharynx  Neck: Supple, symmetrical  Lungs:   clear to auscultation bilaterally, Respirations unlabored, no coughing  Heart:  regular rate and rhythm and no murmur, Appears  well perfused  Extremities: No edema  Skin: Skin color, texture, turgor normal, no rashes or lesions on visualized portions of skin  Neurologic: No gross deficits   Previous notes and tests were reviewed. The plan was reviewed with the patient/family, and all questions/concerned were addressed.  It was my pleasure to see Derrick Harrison today and participate in his care. Please feel free to contact me with any questions or concerns.  Sincerely,  Ferol Luz, MD  Allergy & Immunology  [facility] Office: [phone]

## 2023-03-20 NOTE — Patient Instructions (Addendum)
Chronic Rhinitis Seasonal and Perennial Allergic:  - Control: Well controlled  - Prevention:  -  Continue allergen avoidance  - Consider allergy shots as long term control of your symptoms by teaching your immune system to be more tolerant of your allergy triggers  - Symptom control: - Continue Nasal Steroid Spray: Best results if used daily. - Options include Flonase (fluticasone), Nasocort (triamcinolone), Nasonex (mometasome) 1- 2 sprays in each nostril daily.  - All can be purchased over-the-counter if not covered by insurance. -  Change   Antihistamine: to daily as needed.   -Options include Zyrtec (Cetirizine) 10mg , Claritin (Loratadine) 10mg , Allegra (Fexofenadine) 180mg , or Xyzal (Levocetirinze) 5mg  - Can be purchased over-the-counter if not covered by insurance.  - AIT PLAN:  continue allergy injections per protocol and carry epipen   Allergic Conjunctivitis:  - Continue Allergy Eye drops-great options include Pataday (Olopatadine) or Zaditor (ketotifen) for eye symptoms daily as needed-both sold over the counter if not covered by insurance. and Rewetting Drops such as Systane,TheraTears, etc  -Avoid eye drops that say red eye relief as they may contain medications that dry out your eyes.  Food allergy:  - please strictly avoid Mushrooms  - for SKIN only reaction, okay to take Benadryl 1 capsules every 6 hours - for SKIN + ANY additional symptoms, OR IF concern for LIFE THREATENING reaction = Epipen Autoinjector EpiPen 0.3 mg. - If using Epinephrine autoinjector, call 911   Follow up: 12 months   Thank you so much for letting me partake in your care today.  Don't hesitate to reach out if you have any additional concerns!  Ferol Luz, MD  [facility] [place], [place]

## 2023-03-27 ENCOUNTER — Ambulatory Visit (INDEPENDENT_AMBULATORY_CARE_PROVIDER_SITE_OTHER): Payer: No Typology Code available for payment source

## 2023-03-27 DIAGNOSIS — J309 Allergic rhinitis, unspecified: Secondary | ICD-10-CM | POA: Diagnosis not present

## 2023-04-05 ENCOUNTER — Ambulatory Visit (INDEPENDENT_AMBULATORY_CARE_PROVIDER_SITE_OTHER): Payer: No Typology Code available for payment source

## 2023-04-05 DIAGNOSIS — J309 Allergic rhinitis, unspecified: Secondary | ICD-10-CM | POA: Diagnosis not present

## 2023-04-12 ENCOUNTER — Ambulatory Visit (INDEPENDENT_AMBULATORY_CARE_PROVIDER_SITE_OTHER): Payer: No Typology Code available for payment source

## 2023-04-12 DIAGNOSIS — J309 Allergic rhinitis, unspecified: Secondary | ICD-10-CM

## 2023-04-18 ENCOUNTER — Ambulatory Visit (INDEPENDENT_AMBULATORY_CARE_PROVIDER_SITE_OTHER): Payer: No Typology Code available for payment source

## 2023-04-18 DIAGNOSIS — J309 Allergic rhinitis, unspecified: Secondary | ICD-10-CM | POA: Diagnosis not present

## 2023-05-02 ENCOUNTER — Ambulatory Visit (INDEPENDENT_AMBULATORY_CARE_PROVIDER_SITE_OTHER): Payer: No Typology Code available for payment source

## 2023-05-02 DIAGNOSIS — J309 Allergic rhinitis, unspecified: Secondary | ICD-10-CM

## 2023-05-08 ENCOUNTER — Ambulatory Visit (INDEPENDENT_AMBULATORY_CARE_PROVIDER_SITE_OTHER): Payer: No Typology Code available for payment source

## 2023-05-08 DIAGNOSIS — J309 Allergic rhinitis, unspecified: Secondary | ICD-10-CM | POA: Diagnosis not present

## 2023-05-17 ENCOUNTER — Ambulatory Visit (INDEPENDENT_AMBULATORY_CARE_PROVIDER_SITE_OTHER): Payer: No Typology Code available for payment source

## 2023-05-17 DIAGNOSIS — J309 Allergic rhinitis, unspecified: Secondary | ICD-10-CM

## 2023-05-23 ENCOUNTER — Ambulatory Visit (INDEPENDENT_AMBULATORY_CARE_PROVIDER_SITE_OTHER): Payer: No Typology Code available for payment source

## 2023-05-23 DIAGNOSIS — J309 Allergic rhinitis, unspecified: Secondary | ICD-10-CM | POA: Diagnosis not present

## 2023-05-31 ENCOUNTER — Ambulatory Visit (INDEPENDENT_AMBULATORY_CARE_PROVIDER_SITE_OTHER): Payer: No Typology Code available for payment source

## 2023-05-31 DIAGNOSIS — J309 Allergic rhinitis, unspecified: Secondary | ICD-10-CM

## 2023-06-06 ENCOUNTER — Ambulatory Visit (INDEPENDENT_AMBULATORY_CARE_PROVIDER_SITE_OTHER): Payer: No Typology Code available for payment source

## 2023-06-06 DIAGNOSIS — J309 Allergic rhinitis, unspecified: Secondary | ICD-10-CM | POA: Diagnosis not present

## 2023-06-14 ENCOUNTER — Ambulatory Visit (INDEPENDENT_AMBULATORY_CARE_PROVIDER_SITE_OTHER): Payer: No Typology Code available for payment source

## 2023-06-14 DIAGNOSIS — J309 Allergic rhinitis, unspecified: Secondary | ICD-10-CM | POA: Diagnosis not present

## 2023-06-21 ENCOUNTER — Ambulatory Visit (INDEPENDENT_AMBULATORY_CARE_PROVIDER_SITE_OTHER): Payer: No Typology Code available for payment source

## 2023-06-21 DIAGNOSIS — J309 Allergic rhinitis, unspecified: Secondary | ICD-10-CM

## 2023-06-24 ENCOUNTER — Ambulatory Visit (INDEPENDENT_AMBULATORY_CARE_PROVIDER_SITE_OTHER): Payer: No Typology Code available for payment source

## 2023-06-24 DIAGNOSIS — J309 Allergic rhinitis, unspecified: Secondary | ICD-10-CM | POA: Diagnosis not present

## 2023-07-01 ENCOUNTER — Ambulatory Visit (INDEPENDENT_AMBULATORY_CARE_PROVIDER_SITE_OTHER): Payer: No Typology Code available for payment source

## 2023-07-01 DIAGNOSIS — J309 Allergic rhinitis, unspecified: Secondary | ICD-10-CM

## 2023-07-11 ENCOUNTER — Ambulatory Visit (INDEPENDENT_AMBULATORY_CARE_PROVIDER_SITE_OTHER): Payer: No Typology Code available for payment source | Admitting: *Deleted

## 2023-07-11 DIAGNOSIS — J309 Allergic rhinitis, unspecified: Secondary | ICD-10-CM | POA: Diagnosis not present

## 2023-07-18 ENCOUNTER — Ambulatory Visit (INDEPENDENT_AMBULATORY_CARE_PROVIDER_SITE_OTHER): Payer: No Typology Code available for payment source

## 2023-07-18 DIAGNOSIS — J309 Allergic rhinitis, unspecified: Secondary | ICD-10-CM

## 2023-07-26 ENCOUNTER — Ambulatory Visit (INDEPENDENT_AMBULATORY_CARE_PROVIDER_SITE_OTHER): Payer: No Typology Code available for payment source

## 2023-07-26 DIAGNOSIS — J309 Allergic rhinitis, unspecified: Secondary | ICD-10-CM

## 2023-08-02 ENCOUNTER — Ambulatory Visit (INDEPENDENT_AMBULATORY_CARE_PROVIDER_SITE_OTHER): Payer: No Typology Code available for payment source | Admitting: *Deleted

## 2023-08-02 DIAGNOSIS — J309 Allergic rhinitis, unspecified: Secondary | ICD-10-CM

## 2023-08-06 ENCOUNTER — Ambulatory Visit (INDEPENDENT_AMBULATORY_CARE_PROVIDER_SITE_OTHER): Payer: No Typology Code available for payment source

## 2023-08-06 DIAGNOSIS — J309 Allergic rhinitis, unspecified: Secondary | ICD-10-CM | POA: Diagnosis not present

## 2023-08-16 ENCOUNTER — Ambulatory Visit (INDEPENDENT_AMBULATORY_CARE_PROVIDER_SITE_OTHER): Payer: No Typology Code available for payment source

## 2023-08-16 DIAGNOSIS — J309 Allergic rhinitis, unspecified: Secondary | ICD-10-CM

## 2023-08-22 ENCOUNTER — Ambulatory Visit (INDEPENDENT_AMBULATORY_CARE_PROVIDER_SITE_OTHER): Payer: No Typology Code available for payment source

## 2023-08-22 DIAGNOSIS — J309 Allergic rhinitis, unspecified: Secondary | ICD-10-CM | POA: Diagnosis not present

## 2023-08-30 ENCOUNTER — Ambulatory Visit (INDEPENDENT_AMBULATORY_CARE_PROVIDER_SITE_OTHER): Payer: No Typology Code available for payment source

## 2023-08-30 DIAGNOSIS — J309 Allergic rhinitis, unspecified: Secondary | ICD-10-CM

## 2023-09-02 ENCOUNTER — Ambulatory Visit (INDEPENDENT_AMBULATORY_CARE_PROVIDER_SITE_OTHER): Payer: No Typology Code available for payment source

## 2023-09-02 DIAGNOSIS — J309 Allergic rhinitis, unspecified: Secondary | ICD-10-CM

## 2023-09-13 ENCOUNTER — Ambulatory Visit (INDEPENDENT_AMBULATORY_CARE_PROVIDER_SITE_OTHER): Payer: No Typology Code available for payment source

## 2023-09-13 DIAGNOSIS — J309 Allergic rhinitis, unspecified: Secondary | ICD-10-CM | POA: Diagnosis not present

## 2023-09-19 ENCOUNTER — Ambulatory Visit (INDEPENDENT_AMBULATORY_CARE_PROVIDER_SITE_OTHER): Payer: No Typology Code available for payment source

## 2023-09-19 DIAGNOSIS — J309 Allergic rhinitis, unspecified: Secondary | ICD-10-CM | POA: Diagnosis not present

## 2023-09-27 ENCOUNTER — Ambulatory Visit (INDEPENDENT_AMBULATORY_CARE_PROVIDER_SITE_OTHER): Payer: No Typology Code available for payment source

## 2023-09-27 DIAGNOSIS — J309 Allergic rhinitis, unspecified: Secondary | ICD-10-CM | POA: Diagnosis not present

## 2023-10-04 ENCOUNTER — Ambulatory Visit (INDEPENDENT_AMBULATORY_CARE_PROVIDER_SITE_OTHER): Payer: No Typology Code available for payment source

## 2023-10-04 DIAGNOSIS — J309 Allergic rhinitis, unspecified: Secondary | ICD-10-CM | POA: Diagnosis not present

## 2023-10-11 ENCOUNTER — Ambulatory Visit (INDEPENDENT_AMBULATORY_CARE_PROVIDER_SITE_OTHER): Payer: No Typology Code available for payment source

## 2023-10-11 DIAGNOSIS — J309 Allergic rhinitis, unspecified: Secondary | ICD-10-CM

## 2023-10-18 ENCOUNTER — Ambulatory Visit (INDEPENDENT_AMBULATORY_CARE_PROVIDER_SITE_OTHER): Payer: No Typology Code available for payment source

## 2023-10-18 DIAGNOSIS — J309 Allergic rhinitis, unspecified: Secondary | ICD-10-CM

## 2023-10-24 ENCOUNTER — Ambulatory Visit (INDEPENDENT_AMBULATORY_CARE_PROVIDER_SITE_OTHER): Payer: No Typology Code available for payment source

## 2023-10-24 DIAGNOSIS — J309 Allergic rhinitis, unspecified: Secondary | ICD-10-CM

## 2023-11-01 ENCOUNTER — Ambulatory Visit (INDEPENDENT_AMBULATORY_CARE_PROVIDER_SITE_OTHER): Payer: No Typology Code available for payment source

## 2023-11-01 DIAGNOSIS — J309 Allergic rhinitis, unspecified: Secondary | ICD-10-CM

## 2023-12-02 DIAGNOSIS — J3089 Other allergic rhinitis: Secondary | ICD-10-CM | POA: Diagnosis not present

## 2023-12-02 NOTE — Progress Notes (Signed)
 VIALS MADE 12-02-23

## 2023-12-03 DIAGNOSIS — J302 Other seasonal allergic rhinitis: Secondary | ICD-10-CM | POA: Diagnosis not present

## 2023-12-18 ENCOUNTER — Telehealth: Payer: Self-pay | Admitting: Internal Medicine

## 2023-12-18 NOTE — Telephone Encounter (Signed)
 LVM to inform pt that new authorization has been sent in and for him to call [facility] and get a new authorization as well.

## 2024-01-01 NOTE — Telephone Encounter (Signed)
 Pt referral has been placed in chart

## 2024-01-08 ENCOUNTER — Ambulatory Visit (INDEPENDENT_AMBULATORY_CARE_PROVIDER_SITE_OTHER): Admitting: Internal Medicine

## 2024-01-08 VITALS — BP 142/88 | HR 86 | Temp 97.5°F | Resp 15 | Wt 231.2 lb

## 2024-01-08 DIAGNOSIS — H1013 Acute atopic conjunctivitis, bilateral: Secondary | ICD-10-CM | POA: Diagnosis not present

## 2024-01-08 DIAGNOSIS — W57XXXD Bitten or stung by nonvenomous insect and other nonvenomous arthropods, subsequent encounter: Secondary | ICD-10-CM

## 2024-01-08 DIAGNOSIS — T7800XA Anaphylactic reaction due to unspecified food, initial encounter: Secondary | ICD-10-CM

## 2024-01-08 DIAGNOSIS — T7800XD Anaphylactic reaction due to unspecified food, subsequent encounter: Secondary | ICD-10-CM

## 2024-01-08 DIAGNOSIS — H1010 Acute atopic conjunctivitis, unspecified eye: Secondary | ICD-10-CM

## 2024-01-08 DIAGNOSIS — J3089 Other allergic rhinitis: Secondary | ICD-10-CM

## 2024-01-08 DIAGNOSIS — W57XXXA Bitten or stung by nonvenomous insect and other nonvenomous arthropods, initial encounter: Secondary | ICD-10-CM

## 2024-01-08 DIAGNOSIS — J302 Other seasonal allergic rhinitis: Secondary | ICD-10-CM

## 2024-01-08 MED ORDER — TRIAMCINOLONE ACETONIDE 0.1 % EX OINT: TOPICAL_OINTMENT | CUTANEOUS | 1 refills | Status: AC

## 2024-01-08 NOTE — Progress Notes (Signed)
 Follow Up Note  RE: Crawford Guadagnino MRN: [id number] DOB: 27-Mar-1961 Date of Office Visit: 01/08/2024  Referring provider: [facility] Primary care provider: [facility]  Chief Complaint: Follow-up (Pt states he's been doing well, but he's been getting bitten by Mosquitoes and they are leaving marks so he's concern if he could be allergic.) and Allergic Rhinitis   History of Present Illness: I had the pleasure of seeing Saabir Batz for a follow up visit at the [facility] on 01/09/2024. He is a 63 y.o. male, who is being followed for allergic rhinitis on AIT. His previous allergy  office visit was on  03/20/23 with  Dr. Lydia Sams  . Today is a regular follow up visit.  History obtained from patient, chart review.  Pertinent History/Diagnostics:  - Allergic Rhinitis: flares spring/summer, onset 2022, nasal congestion, drainage, watery itchy yes   - SPT environmental panel (01/23/23): grasses, weeds, mold, cockroach, dust mite   - AIT started 02/18/23   - Comorbid GERD- controlled on omeprazole  - Food Allergy  (mushroom)  - Hx of reaction: urticaria on face and throat swelling, rx'd in ED   - SPT select foods (01/23/23): negative   - sIgE (01/23/23) ordered not done   Today he reports:  Discussed the use of AI scribe software for clinical note transcription with the patient, who gave verbal consent to proceed.  History of Present Illness Dennie Rathsack is a 63 year old male who presents for follow-up of allergy  injections and concerns about mosquito bites.  His allergy  injections are proceeding well without any adverse reactions, and he feels they are effectively managing his symptoms. He continues to use Flonase  nasal spray and takes a daily antihistamine, especially on injection days, to prevent reactions. No breakthrough allergy  symptoms are noted.  He is concerned about mosquito bites, which now cause marks, swelling, significant itching, and have  started leaving scars. He has been using topical steroids to manage the swelling and itching. He denies any changes to his health history or new medications. He is planning a trip to [place] in June and recently traveled to the [place] for spring break.  No breakthrough allergy  symptoms.    Assessment and Plan: Leiam is a 63 y.o. male with: Seasonal and perennial allergic rhinitis  Seasonal allergic conjunctivitis  Allergy  with anaphylaxis due to food  Mosquito bite, initial encounter   Plan:   Chronic Rhinitis Seasonal and Perennial Allergic:  - Control: Well controlled  - Prevention:  -  Continue allergen avoidance   - Symptom control: - Continue Nasal Steroid Spray:Flonase  1 spray per nostril daily  - Continue Antihistamine: to daily as needed.   -Options include Zyrtec  (Cetirizine ) 10mg , Claritin (Loratadine) 10mg , Allegra (Fexofenadine) 180mg , or Xyzal (Levocetirinze) 5mg  - Can be purchased over-the-counter if not covered by insurance.  - AIT PLAN:  continue allergy  injections per protocol and carry epipen    Allergic Conjunctivitis:  - Continue Allergy  Eye drops-great options include Pataday  (Olopatadine ) or Zaditor (ketotifen) for eye symptoms daily as needed-both sold over the counter if not covered by insurance. and Rewetting Drops such as Systane,TheraTears, etc  -Avoid eye drops that say red eye relief as they may contain medications that dry out your eyes.  Food allergy :  - please strictly avoid Mushrooms  - for SKIN only reaction, okay to take Benadryl 1 capsules every 6 hours - for SKIN + ANY additional symptoms, OR IF concern for LIFE THREATENING reaction = Epipen  Autoinjector EpiPen  0.3 mg. -  If using Epinephrine  autoinjector, call 911  Mosquito/insect bites Avoidance measures (DEET repellant for mosquitos, long clothing, etc) If bite occurs with raised rash:  - For itch: Topical steroid (hydrocortisone cream) twice daily as needed + oral antihistamine  (zyrtec ) - For pain and swelling: Oral anti-inflammatory (ibuprofen), ice affected area   Follow up: 12 months   Thank you so much for letting me partake in your care today.  Don't hesitate to reach out if you have any additional concerns!  Orelia Binet, MD  [facility] [place], [place]    Meds ordered this encounter  Medications   triamcinolone  ointment (KENALOG ) 0.1 %    Sig: Apply topically twice daily to BODY as needed for red, sandpaper like rash.  Do not use on face, groin or armpits.    Dispense:  80 g    Refill:  1    Lab Orders  No laboratory test(s) ordered today   Diagnostics: None done   Medication List:  Current Outpatient Medications  Medication Sig Dispense Refill   triamcinolone  ointment (KENALOG ) 0.1 % Apply topically twice daily to BODY as needed for red, sandpaper like rash.  Do not use on face, groin or armpits. 80 g 1   allopurinol (ZYLOPRIM) 300 MG tablet Take by mouth.     azelastine  (ASTELIN ) 0.1 % nasal spray Place 1 spray into both nostrils 2 (two) times daily as needed. Use in each nostril as directed 30 mL 5   cetirizine  (ZYRTEC  ALLERGY ) 10 MG tablet Take 1 tablet (10 mg total) by mouth daily as needed for allergies. 30 tablet 5   EPINEPHrine  (EPIPEN  2-PAK) 0.3 mg/0.3 mL IJ SOAJ injection Inject 0.3 mg into the muscle as needed for anaphylaxis. 2 each 1   fluticasone  (FLONASE ) 50 MCG/ACT nasal spray Place 2 sprays into both nostrils daily. 16 g 5   lisinopril (ZESTRIL) 10 MG tablet Take by mouth.     Olopatadine  HCl 0.2 % SOLN Apply 1 drop to eye daily as needed (itchy watery eyes). 2.5 mL 5   ondansetron  (ZOFRAN  ODT) 4 MG disintegrating tablet 4mg  ODT q4 hours prn nausea/vomit 30 tablet 0   sertraline (ZOLOFT) 100 MG tablet Take by mouth.     sildenafil (VIAGRA) 100 MG tablet Take by mouth.     SUMAtriptan (IMITREX) 50 MG tablet Take by mouth.     tamsulosin (FLOMAX) 0.4 MG CAPS capsule Take by mouth.     traZODone (DESYREL) 50  MG tablet Take by mouth.     No current facility-administered medications for this visit.   Allergies: Allergies  Allergen Reactions   Mushroom Extract Complex (Obsolete) Swelling   Atorvastatin Hives and Other (See Comments)    Bones ache   Latex Hives   Tea Tree Oil Rash    Mushroom allergy    I reviewed his past medical history, social history, family history, and environmental history and no significant changes have been reported from his previous visit.  ROS: All others negative except as noted per HPI.   Objective: BP (!) 142/88   Pulse 86   Temp (!) 97.5 F (36.4 C) (Temporal)   Resp 15   Wt 231 lb 3.2 oz (104.9 kg)   SpO2 98%   BMI 36.76 kg/m  Body mass index is 36.76 kg/m. General Appearance:  Alert, cooperative, no distress, appears stated age  Head:  Normocephalic, without obvious abnormality, atraumatic  Eyes:  Conjunctiva clear, EOM's intact  Nose: Nares normal, hypertrophic turbinates, normal mucosa,  no visible anterior polyps, and septum midline  Throat: Lips, tongue normal; teeth and gums normal, normal posterior oropharynx  Neck: Supple, symmetrical  Lungs:   clear to auscultation bilaterally, Respirations unlabored, no coughing  Heart:  regular rate and rhythm and no murmur, Appears well perfused  Extremities: No edema  Skin: Skin color, texture, turgor normal, no rashes or lesions on visualized portions of skin  Neurologic: No gross deficits   Previous notes and tests were reviewed. The plan was reviewed with the patient/family, and all questions/concerned were addressed.  It was my pleasure to see Trevond today and participate in his care. Please feel free to contact me with any questions or concerns.  Sincerely,  Orelia Binet, MD  Allergy  & Immunology  [facility] [place]  [place] Office: [phone]

## 2024-01-08 NOTE — Patient Instructions (Addendum)
 Chronic Rhinitis Seasonal and Perennial Allergic:  - Control: Well controlled  - Prevention:  -  Continue allergen avoidance   - Symptom control: - Continue Nasal Steroid Spray:Flonase  1 spray per nostril daily  - Continue Antihistamine: to daily as needed.   -Options include Zyrtec  (Cetirizine ) 10mg , Claritin (Loratadine) 10mg , Allegra (Fexofenadine) 180mg , or Xyzal (Levocetirinze) 5mg  - Can be purchased over-the-counter if not covered by insurance.  - AIT PLAN:  continue allergy  injections per protocol and carry epipen    Allergic Conjunctivitis:  - Continue Allergy  Eye drops-great options include Pataday  (Olopatadine ) or Zaditor (ketotifen) for eye symptoms daily as needed-both sold over the counter if not covered by insurance. and Rewetting Drops such as Systane,TheraTears, etc  -Avoid eye drops that say red eye relief as they may contain medications that dry out your eyes.  Food allergy :  - please strictly avoid Mushrooms  - for SKIN only reaction, okay to take Benadryl 1 capsules every 6 hours - for SKIN + ANY additional symptoms, OR IF concern for LIFE THREATENING reaction = Epipen  Autoinjector EpiPen  0.3 mg. - If using Epinephrine  autoinjector, call 911  Mosquito/insect bites Avoidance measures (DEET repellant for mosquitos, long clothing, etc) If bite occurs with raised rash:  - For itch: Topical steroid (hydrocortisone cream) twice daily as needed + oral antihistamine (zyrtec ) - For pain and swelling: Oral anti-inflammatory (ibuprofen), ice affected area   Follow up: 12 months   Thank you so much for letting me partake in your care today.  Don't hesitate to reach out if you have any additional concerns!  Orelia Binet, MD  [facility] [place], [place]

## 2024-01-31 ENCOUNTER — Ambulatory Visit: Payer: Self-pay | Admitting: *Deleted

## 2024-01-31 NOTE — Progress Notes (Signed)
 Immunotherapy   Patient Details  Name: Derrick Harrison MRN: [id number] Date of Birth: [birth year]  01/31/2024  Patient came in for his allergy  shots. He is 13 weeks late, previous dose was on first red vial 0.45cc. Per Dr Cornel Diesel we will schedule him for rush therapy, [name] was okay with this plan.    Brock Canner 01/31/2024, 9:29 AM

## 2024-03-17 DIAGNOSIS — J3089 Other allergic rhinitis: Secondary | ICD-10-CM | POA: Diagnosis not present

## 2024-03-17 NOTE — Progress Notes (Addendum)
 VIALS WERE ABLE TO BE DILUTED FROM RED 1/100 THAT PT HAD NOT STARTED.

## 2024-03-18 DIAGNOSIS — J302 Other seasonal allergic rhinitis: Secondary | ICD-10-CM | POA: Diagnosis not present

## 2024-04-03 ENCOUNTER — Telehealth: Payer: Self-pay

## 2024-04-03 MED ORDER — PREDNISONE 20 MG PO TABS: ORAL_TABLET | ORAL | 0 refills | Status: DC

## 2024-04-03 MED ORDER — FAMOTIDINE 20 MG PO TABS: ORAL_TABLET | ORAL | 0 refills | Status: DC

## 2024-04-03 MED ORDER — MONTELUKAST SODIUM 10 MG PO TABS: ORAL_TABLET | ORAL | 0 refills | Status: DC

## 2024-04-03 NOTE — Telephone Encounter (Signed)
 Pre-meds sent to pharmacy. Pt notify.

## 2024-04-06 MED ORDER — FAMOTIDINE 20 MG PO TABS: ORAL_TABLET | ORAL | 0 refills | Status: AC

## 2024-04-06 MED ORDER — PREDNISONE 20 MG PO TABS: ORAL_TABLET | ORAL | 0 refills | Status: AC

## 2024-04-06 MED ORDER — MONTELUKAST SODIUM 10 MG PO TABS: ORAL_TABLET | ORAL | 0 refills | Status: AC

## 2024-04-06 MED ORDER — EPINEPHRINE 0.3 MG/0.3ML IJ SOAJ: 0.3000 mg | INTRAMUSCULAR | 1 refills | Status: AC | PRN

## 2024-04-06 NOTE — Addendum Note (Signed)
 Addended by: WILLIAMS, Kasiyah Platter H on: 04/06/2024 10:19 AM   Modules accepted: Orders

## 2024-04-07 ENCOUNTER — Ambulatory Visit: Admitting: Internal Medicine
